# Patient Record
Sex: Male | Born: 1990 | Race: Black or African American | Hispanic: No | Marital: Married | Smoking: Never smoker
Health system: Southern US, Community
[De-identification: ages and names within clinical notes are randomized; demographics above are authoritative.]

---

## 2020-11-16 ENCOUNTER — Emergency Department
Admission: EM | Admit: 2020-11-16 | Discharge: 2020-11-16 | Disposition: A | Discharge status: Home / Self Care | Payer: Commercial Managed Care - PPO | Attending: Emergency Medicine | Admitting: Emergency Medicine

## 2020-11-16 DIAGNOSIS — S61112A Laceration without foreign body of left thumb with damage to nail, initial encounter: Secondary | ICD-10-CM

## 2020-11-16 DIAGNOSIS — Y92090 Kitchen in other non-institutional residence as the place of occurrence of the external cause: Secondary | ICD-10-CM | POA: Insufficient documentation

## 2020-11-16 DIAGNOSIS — S6992XA Unspecified injury of left wrist, hand and finger(s), initial encounter: Secondary | ICD-10-CM | POA: Diagnosis present

## 2020-11-16 DIAGNOSIS — W260XXA Contact with knife, initial encounter: Secondary | ICD-10-CM | POA: Insufficient documentation

## 2020-11-16 NOTE — ED Triage Notes (Signed)
Patient presents to ER from home. Patient reports cutting right thumb with knife approx 20 min ago. Bleeding controlled. Patient A&OX3.

## 2020-11-16 NOTE — ED Provider Notes (Signed)
[facility] Emergency Department Provider Note  ____________________________________________  Time seen: Approximately 8:25 PM  I have reviewed the triage vital signs and the nursing notes.   HISTORY  Chief Complaint Laceration    HPI Derrick Stewart is a 30 y.o. male who presents the emergency department complaining of a laceration to the left thumb.  Patient states that he does not believe the laceration is severe but he cannot get the bleeding to stop initially.  He was using a knife in the kitchen preparing food when he cut his thumb.  This went through the nail.  Patient has since been able to control bleeding.  Has full range of motion.  Up-to-date on immunizations.  No other complaints at this time.       History reviewed. No pertinent past medical history.  There are no problems to display for this patient.   History reviewed. No pertinent surgical history.  Prior to Admission medications   Not on File    Allergies Patient has no known allergies.  History reviewed. No pertinent family history.  Social History Social History   Tobacco Use   Smoking status: Never   Smokeless tobacco: Never  Substance Use Topics   Alcohol use: Yes   Drug use: Never     Review of Systems  Constitutional: No fever/chills Eyes: No visual changes. No discharge ENT: No upper respiratory complaints. Cardiovascular: no chest pain. Respiratory: no cough. No SOB. Gastrointestinal: No abdominal pain.  No nausea, no vomiting.  No diarrhea.  No constipation. Musculoskeletal: Positive for laceration to the left thumb Skin: Negative for rash, abrasions, lacerations, ecchymosis. Neurological: Negative for headaches, focal weakness or numbness.  10 System ROS otherwise negative.  ____________________________________________   PHYSICAL EXAM:  VITAL SIGNS: ED Triage Vitals  Enc Vitals Group     BP 11/16/20 1747 (!) 143/78     Pulse Rate 11/16/20 1747 81      Resp 11/16/20 1747 18     Temp 11/16/20 1747 100 F (37.8 C)     Temp Source 11/16/20 1747 Oral     SpO2 11/16/20 1747 97 %     Weight --      Height --      Head Circumference --      Peak Flow --      Pain Score 11/16/20 1751 0     Pain Loc --      Pain Edu? --      Excl. in GC? --      Constitutional: Alert and oriented. Well appearing and in no acute distress. Eyes: Conjunctivae are normal. PERRL. EOMI. Head: Atraumatic. ENT:      Ears:       Nose: No congestion/rhinnorhea.      Mouth/Throat: Mucous membranes are moist.  Neck: No stridor.    Cardiovascular: Normal rate, regular rhythm. Normal S1 and S2.  Good peripheral circulation. Respiratory: Normal respiratory effort without tachypnea or retractions. Lungs CTAB. Good air entry to the bases with no decreased or absent breath sounds. Musculoskeletal: Full range of motion to all extremities. No gross deformities appreciated.  Positive for laceration to the left thumb with what appears to be relatively superficial laceration along the medial aspect of the thumb with injury to the very distal aspect of the nail itself.  The nail is not loose.  There is no visualized foreign body.  No bleeding.  Full range of motion.  Palpation does not elicit any opening of the wound. Neurologic:  Normal speech and language. No gross focal neurologic deficits are appreciated.  Skin:  Skin is warm, dry and intact. No rash noted. Psychiatric: Mood and affect are normal. Speech and behavior are normal. Patient exhibits appropriate insight and judgement.   ____________________________________________   LABS (all labs ordered are listed, but only abnormal results are displayed)  Labs Reviewed - No data to display ____________________________________________  EKG   ____________________________________________  RADIOLOGY   No results found.  ____________________________________________    PROCEDURES  Procedure(s) performed:     Procedures    Medications - No data to display   ____________________________________________   INITIAL IMPRESSION / ASSESSMENT AND PLAN / ED COURSE  Pertinent labs & imaging results that were available during my care of the patient were reviewed by me and considered in my medical decision making (see chart for details).  Review of the [place] CSRS was performed in accordance of the [facility] prior to dispensing any controlled drugs.           Patient's diagnosis is consistent with thumb laceration.  Patient presented to the emergency department complaining of a laceration to the left thumb.  He was using a knife to cut food this evening when he made contact with this thumb.  Overall the laceration appears relatively superficial but did involve the nail itself.  I have applied Dermabond only to stabilize the nail portion so that it would not catch while the patient was using his thumb.  However there was no need for closure of the laceration itself.  Wound care instructions discussed with the patient.  Keep area clean.  Follow-up with primary care as needed.  Return precautions discussed with the patient. Patient is given ED precautions to return to the ED for any worsening or new symptoms.     ____________________________________________  FINAL CLINICAL IMPRESSION(S) / ED DIAGNOSES  Final diagnoses:  Laceration of left thumb without foreign body with damage to nail, initial encounter      NEW MEDICATIONS STARTED DURING THIS VISIT:  ED Discharge Orders     None           This chart was dictated using voice recognition software/Dragon. Despite best efforts to proofread, errors can occur which can change the meaning. Any change was purely unintentional.    Racheal Patches, PA-C 11/16/20 2133    Merwyn Katos, MD 11/17/20 1530

## 2021-04-09 ENCOUNTER — Other Ambulatory Visit: Payer: Self-pay

## 2021-04-09 ENCOUNTER — Emergency Department
Admission: EM | Admit: 2021-04-09 | Discharge: 2021-04-09 | Disposition: A | Discharge status: Home / Self Care | Payer: Commercial Managed Care - PPO | Attending: Emergency Medicine | Admitting: Emergency Medicine

## 2021-04-09 ENCOUNTER — Emergency Department: Payer: Commercial Managed Care - PPO

## 2021-04-09 DIAGNOSIS — Y93G3 Activity, cooking and baking: Secondary | ICD-10-CM | POA: Diagnosis not present

## 2021-04-09 DIAGNOSIS — S61211A Laceration without foreign body of left index finger without damage to nail, initial encounter: Secondary | ICD-10-CM | POA: Diagnosis not present

## 2021-04-09 DIAGNOSIS — Z23 Encounter for immunization: Secondary | ICD-10-CM | POA: Insufficient documentation

## 2021-04-09 DIAGNOSIS — W260XXA Contact with knife, initial encounter: Secondary | ICD-10-CM | POA: Insufficient documentation

## 2021-04-09 DIAGNOSIS — S6992XA Unspecified injury of left wrist, hand and finger(s), initial encounter: Secondary | ICD-10-CM | POA: Diagnosis present

## 2021-04-09 MED ORDER — TETANUS-DIPHTH-ACELL PERTUSSIS 5-2.5-18.5 LF-MCG/0.5 IM SUSY
0.5000 mL | PREFILLED_SYRINGE | Freq: Once | INTRAMUSCULAR | Status: AC
  Administered 2021-04-09: 14:00:00 0.5 mL via INTRAMUSCULAR
  Filled 2021-04-09: qty 0.5

## 2021-04-09 NOTE — ED Provider Notes (Signed)
[facility] Emergency Department Provider Note  ____________________________________________   Event Date/Time   First MD Initiated Contact with Patient 04/09/21 1406     (approximate)  I have reviewed the triage vital signs and the nursing notes.   HISTORY  Chief Complaint Laceration   HPI Derrick Stewart is a 30 y.o. male presents to the ED with laceration to his left index finger.  Patient states that this was cut on a kitchen knife while chopping onions.  Patient is not completely certain of his last tetanus but states that he left the service in 2018.  Currently rates his pain as 10/10.         History reviewed. No pertinent past medical history.  There are no problems to display for this patient.   History reviewed. No pertinent surgical history.  Prior to Admission medications   Not on File    Allergies Patient has no known allergies.  No family history on file.  Social History Social History   Tobacco Use   Smoking status: Never   Smokeless tobacco: Never  Substance Use Topics   Alcohol use: Yes   Drug use: Never    Review of Systems Constitutional: No fever/chills Cardiovascular: Denies chest pain. Respiratory: Denies shortness of breath. Gastrointestinal: No abdominal pain.  No nausea, no vomiting.  Genitourinary: Negative for dysuria. Musculoskeletal: Positive left index finger pain. Skin: Positive laceration left index finger. Neurological: Negative for headaches, focal weakness or numbness. ____________________________________________   PHYSICAL EXAM:  VITAL SIGNS: ED Triage Vitals [04/09/21 1237]  Enc Vitals Group     BP (!) 151/83     Pulse Rate (!) 116     Resp 20     Temp 98.4 F (36.9 C)     Temp Source Oral     SpO2 95 %     Weight 265 lb (120.2 kg)     Height 5\' 10"  (1.778 m)     Head Circumference      Peak Flow      Pain Score 10     Pain Loc      Pain Edu?      Excl. in GC?      Constitutional: Alert and oriented. Well appearing and in no acute distress. Eyes: Conjunctivae are normal.  Head: Atraumatic. Neck: No stridor.   Cardiovascular: Normal rate, regular rhythm. Grossly normal heart sounds.  Good peripheral circulation. Respiratory: Normal respiratory effort.  No retractions.  Musculoskeletal: Examination of the left index finger there is a laceration on the lateral aspect of the proximal phalanx not involving the joint.  Motor sensory function intact.  Capillary refills less than 3 seconds.  Patient is able to flex and extend his finger without difficulty.  No foreign body is noted on visual exam. Neurologic:  Normal speech and language. No gross focal neurologic deficits are appreciated.  Skin:  Skin is warm, dry.  laceration as noted above. Psychiatric: Mood and affect are normal. Speech and behavior are normal.  ____________________________________________   LABS (all labs ordered are listed, but only abnormal results are displayed)  Labs Reviewed - No data to display  RADIOLOGY I, , personally viewed and evaluated these images (plain radiographs) as part of my medical decision making, as well as reviewing the written report by the radiologist.    Official radiology report(s): DG Hand Complete Left  Result Date: 04/09/2021 CLINICAL DATA:  Laceration of the index finger EXAM: LEFT HAND - COMPLETE 3+ VIEW COMPARISON:  None. FINDINGS: No evidence of fracture or radiopaque foreign object. Wrap artifact overlies the proximal index finger. IMPRESSION: Wrap artifact overlies the proximal index finger. No evidence of fracture or radiopaque foreign object. Electronically Signed   By: Paulina Fusi M.D.   On: 04/09/2021 13:15    ____________________________________________   PROCEDURES  Procedure(s) performed (including Critical Care):  Marland KitchenMarland KitchenLaceration Repair  Date/Time: 04/09/2021 2:07 PM Performed by: Tommi Rumps,  PA-C Authorized by: Tommi Rumps, PA-C   Consent:    Consent obtained:  Verbal   Consent given by:  Patient   Risks discussed:  Infection, pain and poor wound healing Universal protocol:    Patient identity confirmed:  Verbally with patient Anesthesia:    Anesthesia method:  Nerve block   Block needle gauge:  25 G   Block anesthetic:  Lidocaine 1% w/o epi   Block injection procedure:  Introduced needle, incremental injection, anatomic landmarks identified and negative aspiration for blood   Block outcome:  Anesthesia achieved Laceration details:    Location:  Finger   Finger location:  L index finger   Length (cm):  3 Pre-procedure details:    Preparation:  Patient was prepped and draped in usual sterile fashion and imaging obtained to evaluate for foreign bodies Exploration:    Limited defect created (wound extended): no     Hemostasis achieved with:  Direct pressure   Imaging outcome: foreign body not noted     Contaminated: no   Treatment:    Area cleansed with:  Povidone-iodine and saline   Amount of cleaning:  Standard   Irrigation solution:  Sterile saline   Irrigation volume:  60 ml   Irrigation method:  Syringe   Visualized foreign bodies/material removed: no     Debridement:  None   Undermining:  None Skin repair:    Repair method:  Sutures   Suture size:  4-0   Suture material:  Nylon   Suture technique:  Simple interrupted   Number of sutures:  6 Approximation:    Approximation:  Close Repair type:    Repair type:  Simple Post-procedure details:    Dressing:  Non-adherent dressing   Procedure completion:  Tolerated well, no immediate complications   ____________________________________________   INITIAL IMPRESSION / ASSESSMENT AND PLAN / ED COURSE  As part of my medical decision making, I reviewed the following data within the electronic MEDICAL RECORD NUMBER Notes from prior ED visits and [place] Controlled Substance Database  30 year old male presents  to the ED with a laceration to his left index finger that occurred in his kitchen while chopping vegetables.  Patient is unsure of his last Tdap.  This was updated.  Patient had sutures to the area and tolerated extremely well.  Patient was strongly advised to follow-up at the urgent care to have sutures removed in 10 to 12 days.  He is to watch for any signs of infection and have this looked at if there are concerns.  Patient was made aware that his x-ray was negative for any foreign bodies or injury to his bone.   ____________________________________________   FINAL CLINICAL IMPRESSION(S) / ED DIAGNOSES  Final diagnoses:  Laceration of left index finger without foreign body without damage to nail, initial encounter     ED Discharge Orders     None        Note:  This document was prepared using Dragon voice recognition software and may include unintentional dictation errors.    Tommi Rumps,  PA-C 04/09/21 1435    Chesley Noon, MD 04/10/21 1547

## 2021-04-09 NOTE — ED Triage Notes (Signed)
Pt to ED for laceration on pointer finger left hand today while using knife. Significant laceration noted, wrapping and guaze applied to control bleeding. Unknown last tetanus. Alert and oriented, Nad noted

## 2021-04-09 NOTE — Discharge Instructions (Signed)
Leave dressing on for 2 days.  Then remove and begin cleaning daily with mild soap and water and allowed to dry completely before applying a dressing to it.  If for any reason there is some concern for infection return to the emergency department or urgent care.  You should be looking for pus, redness or fever.  Sutures should be removed in 10 to 12 days.  You may take over-the-counter medication if needed for pain and elevate your hand.

## 2022-12-22 IMAGING — CR DG HAND COMPLETE 3+V*L*
1 series · 3 of 3 positions shown · non-contrast
Comparison: None.

CLINICAL DATA: Laceration of the index finger

EXAM:
LEFT HAND - COMPLETE 3+ VIEW

[Series 1: dg hand complete left · 0.14mm/px · 3 of 3 slices shown]
[im 1/3]
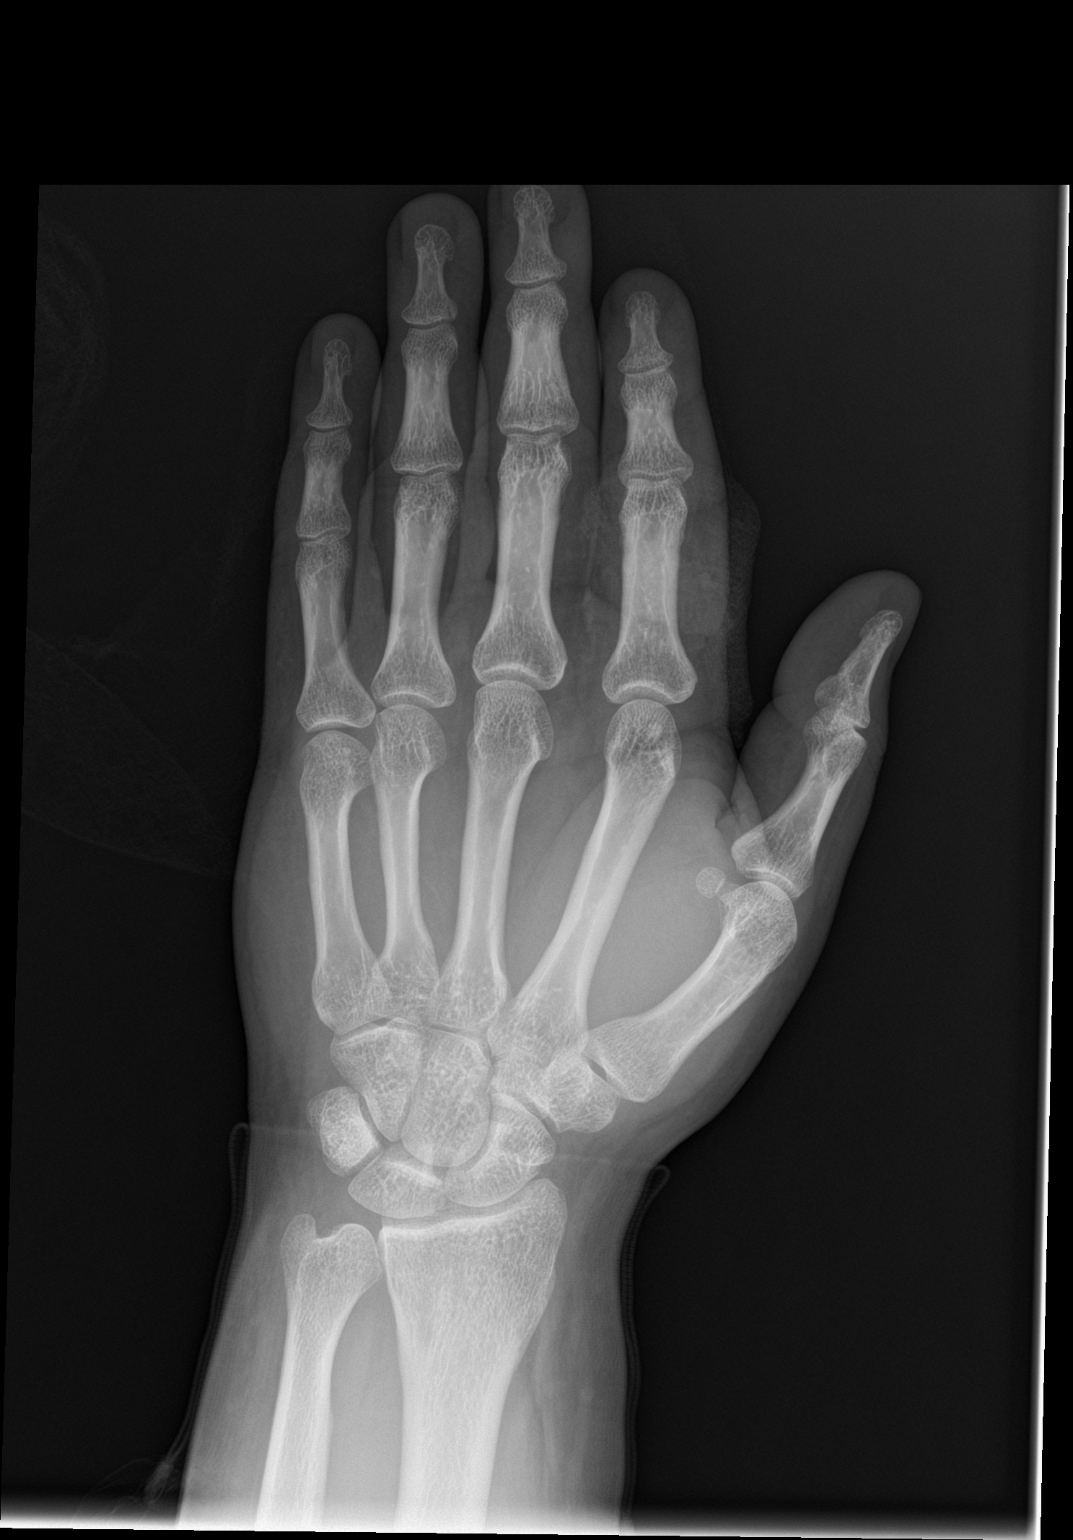
[im 2/3]
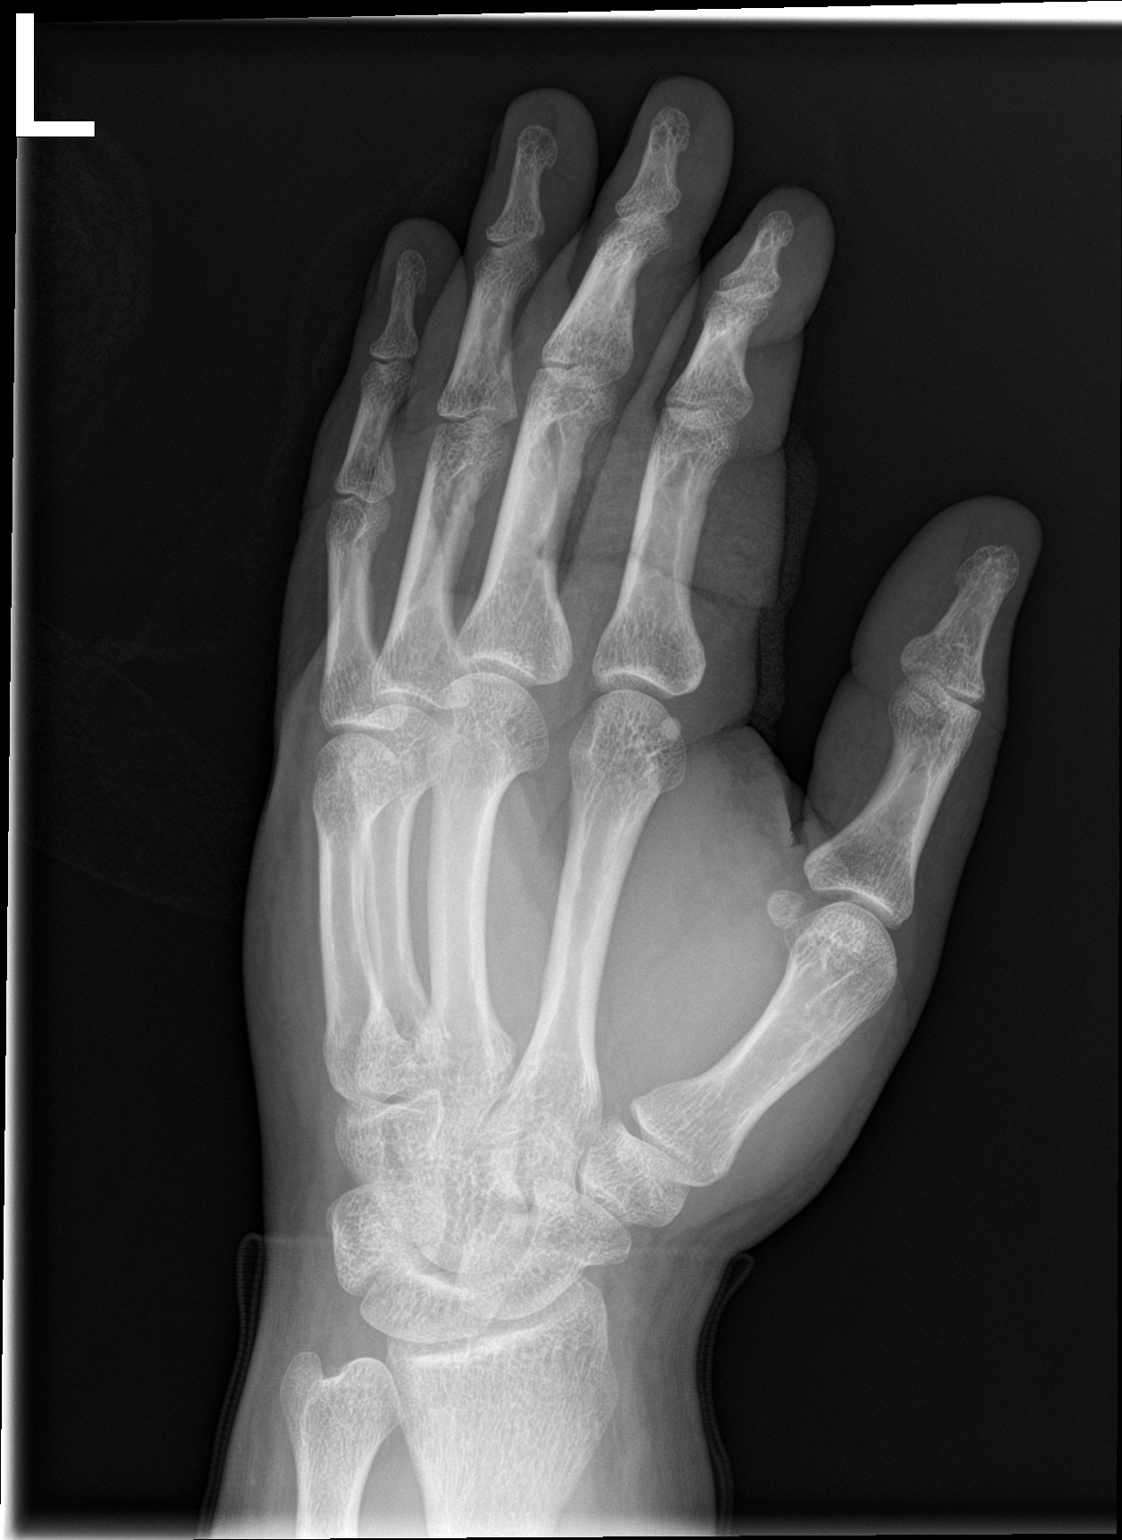
[im 3/3]
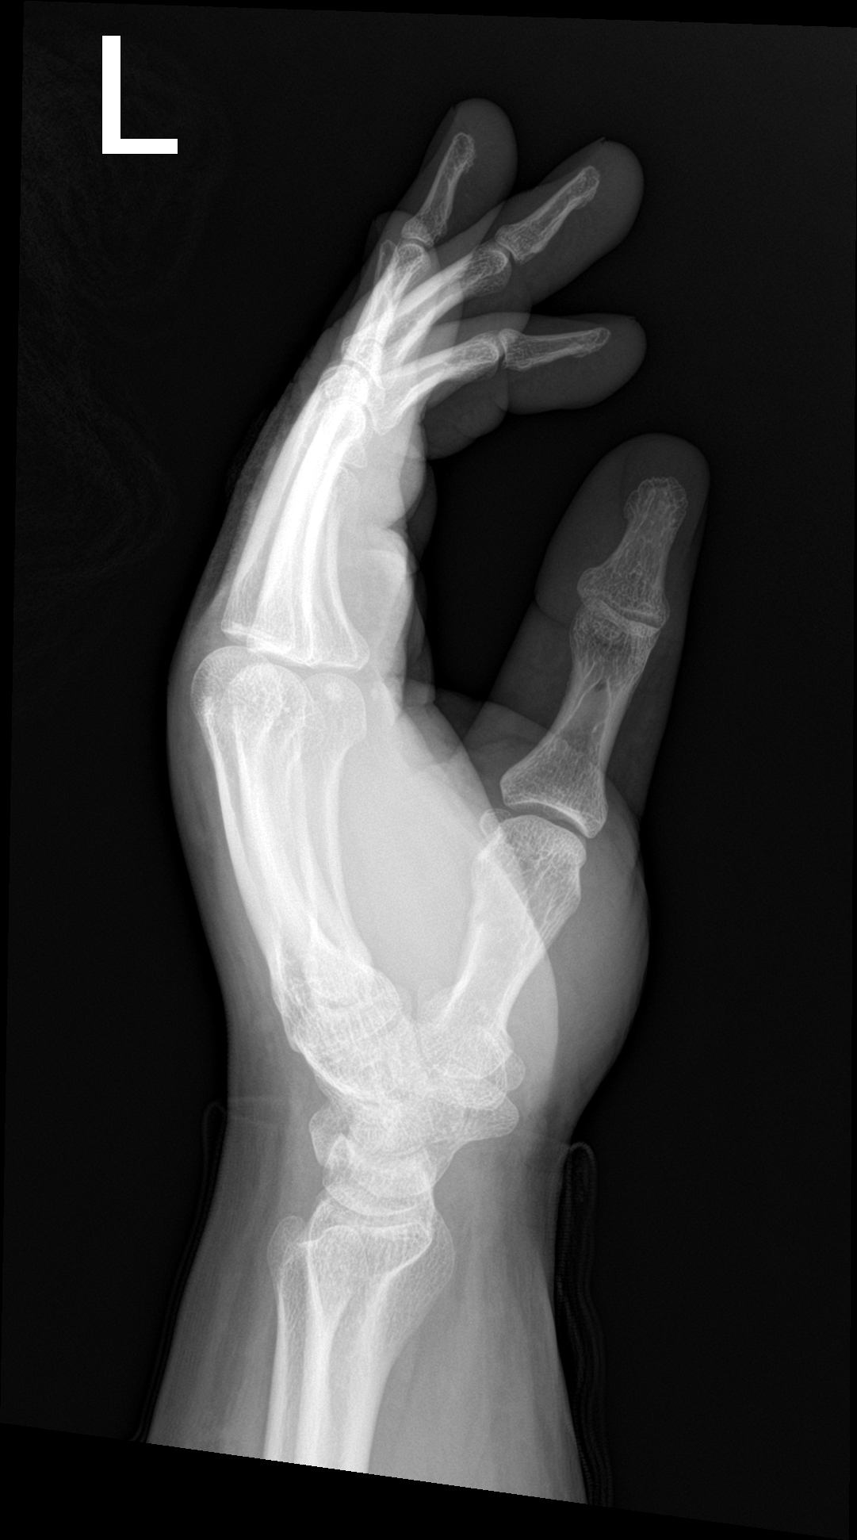

[3 of 3 positions shown; findings below may reference images not displayed]

FINDINGS: No evidence of fracture or radiopaque foreign object. Wrap artifact
overlies the proximal index finger.
IMPRESSION: Wrap artifact overlies the proximal index finger. No evidence of
fracture or radiopaque foreign object.
